# Patient Record
Sex: Male | Born: 1935
Health system: Southern US, Community
[De-identification: ages and names within clinical notes are randomized; demographics above are authoritative.]

## PROBLEM LIST (undated history)

## (undated) DIAGNOSIS — E119 Type 2 diabetes mellitus without complications: Secondary | ICD-10-CM

## (undated) DIAGNOSIS — G20 Parkinson's disease: Secondary | ICD-10-CM

## (undated) DIAGNOSIS — R7303 Prediabetes: Secondary | ICD-10-CM

## (undated) DIAGNOSIS — K219 Gastro-esophageal reflux disease without esophagitis: Secondary | ICD-10-CM

## (undated) DIAGNOSIS — E7800 Pure hypercholesterolemia, unspecified: Secondary | ICD-10-CM

## (undated) DIAGNOSIS — M109 Gout, unspecified: Secondary | ICD-10-CM

## (undated) HISTORY — DX: Parkinson's disease: G20

## (undated) HISTORY — PX: CATARACT EXTRACTION: SUR2

## (undated) HISTORY — DX: Type 2 diabetes mellitus without complications: E11.9

## (undated) HISTORY — DX: Gout, unspecified: M10.9

---

## 2019-04-22 ENCOUNTER — Other Ambulatory Visit: Payer: Self-pay

## 2019-04-22 ENCOUNTER — Emergency Department (HOSPITAL_BASED_OUTPATIENT_CLINIC_OR_DEPARTMENT_OTHER): Payer: Medicare Other

## 2019-04-22 ENCOUNTER — Emergency Department (HOSPITAL_BASED_OUTPATIENT_CLINIC_OR_DEPARTMENT_OTHER)
Admission: EM | Admit: 2019-04-22 | Discharge: 2019-04-22 | Disposition: A | Discharge status: Home / Self Care | Payer: Medicare Other | Attending: Emergency Medicine | Admitting: Emergency Medicine

## 2019-04-22 ENCOUNTER — Encounter (HOSPITAL_BASED_OUTPATIENT_CLINIC_OR_DEPARTMENT_OTHER): Payer: Self-pay | Admitting: Emergency Medicine

## 2019-04-22 DIAGNOSIS — Z79899 Other long term (current) drug therapy: Secondary | ICD-10-CM | POA: Insufficient documentation

## 2019-04-22 DIAGNOSIS — R509 Fever, unspecified: Secondary | ICD-10-CM | POA: Diagnosis not present

## 2019-04-22 DIAGNOSIS — R739 Hyperglycemia, unspecified: Secondary | ICD-10-CM

## 2019-04-22 DIAGNOSIS — R9389 Abnormal findings on diagnostic imaging of other specified body structures: Secondary | ICD-10-CM | POA: Diagnosis not present

## 2019-04-22 DIAGNOSIS — Z20828 Contact with and (suspected) exposure to other viral communicable diseases: Secondary | ICD-10-CM | POA: Insufficient documentation

## 2019-04-22 LAB — CBC
HCT: 36.9 % — ABNORMAL LOW (ref 39.0–52.0)
Hemoglobin: 12.6 g/dL — ABNORMAL LOW (ref 13.0–17.0)
MCH: 32.6 pg (ref 26.0–34.0)
MCHC: 34.1 g/dL (ref 30.0–36.0)
MCV: 95.6 fL (ref 80.0–100.0)
Platelets: 123 10*3/uL — ABNORMAL LOW (ref 150–400)
RBC: 3.86 MIL/uL — ABNORMAL LOW (ref 4.22–5.81)
RDW: 12.4 % (ref 11.5–15.5)
WBC: 15.1 10*3/uL — ABNORMAL HIGH (ref 4.0–10.5)
nRBC: 0 % (ref 0.0–0.2)

## 2019-04-22 LAB — URINALYSIS, ROUTINE W REFLEX MICROSCOPIC
Bilirubin Urine: NEGATIVE
Glucose, UA: 100 mg/dL — AB
Hgb urine dipstick: NEGATIVE
Ketones, ur: NEGATIVE mg/dL
Leukocytes,Ua: NEGATIVE
Nitrite: NEGATIVE
Protein, ur: NEGATIVE mg/dL
Specific Gravity, Urine: 1.02 (ref 1.005–1.030)
pH: 6 (ref 5.0–8.0)

## 2019-04-22 LAB — BASIC METABOLIC PANEL
Anion gap: 12 (ref 5–15)
BUN: 20 mg/dL (ref 8–23)
CO2: 20 mmol/L — ABNORMAL LOW (ref 22–32)
Calcium: 8.4 mg/dL — ABNORMAL LOW (ref 8.9–10.3)
Chloride: 103 mmol/L (ref 98–111)
Creatinine, Ser: 1.13 mg/dL (ref 0.61–1.24)
GFR calc Af Amer: >60 mL/min (ref >60)
GFR calc non Af Amer: >60 mL/min (ref >60)
Glucose, Bld: 258 mg/dL — ABNORMAL HIGH (ref 70–99)
Potassium: 3.8 mmol/L (ref 3.5–5.1)
Sodium: 135 mmol/L (ref 135–145)

## 2019-04-22 LAB — SARS CORONAVIRUS 2 BY RT PCR (HOSPITAL ORDER, PERFORMED IN ~~LOC~~ HOSPITAL LAB): SARS Coronavirus 2: NEGATIVE

## 2019-04-22 MED ORDER — DOXYCYCLINE HYCLATE 100 MG PO CAPS: 100.0000 mg | ORAL_CAPSULE | Freq: Two times a day (BID) | ORAL | 0 refills | Status: AC

## 2019-04-22 MED FILL — DOXYCYCLINE HYCLATE 100 MG: 100 | 10 days supply | Qty: 20 | Fill #0

## 2019-04-22 NOTE — ED Provider Notes (Addendum)
[place] EMERGENCY DEPARTMENT Provider Note   CSN: [id number] Arrival date & time: 04/22/19  0944    History   Chief Complaint Chief Complaint  Patient presents with  . Fever    HPI Jerry Vasquez is a 84 y.o. male.     HPI Patient presents to the emergency room for evaluation of fever.  Patient states he started having symptoms yesterday.  He measured fever up to 102.  He has had some chills and generalized malaise since then.  Last night he did not sleep well because he was not feeling good.  This morning he did not have a fever but he did take some ibuprofen.  He denies any cough.  He denies any sore throat.  He has not noticed any change in his smell or taste.  He denies any dysuria.  No vomiting or diarrhea.   History reviewed. No pertinent past medical history.  There are no active problems to display for this patient.   History reviewed. No pertinent surgical history.      Home Medications    Prior to Admission medications   Medication Sig Start Date End Date Taking? Authorizing Provider  doxycycline (VIBRAMYCIN) 100 MG capsule Take 1 capsule (100 mg total) by mouth 2 (two) times daily. 04/22/19   Dorie Rank, MD  omeprazole (PRILOSEC) 40 MG capsule Take 40 mg by mouth daily. 01/14/19   [provider]  pravastatin (PRAVACHOL) 40 MG tablet Take 40 mg by mouth daily. 03/11/19   [provider]    Family History No family history on file.  Social History Social History   Tobacco Use  . Smoking status: Not on file  Substance Use Topics  . Alcohol use: Not on file  . Drug use: Not on file     Allergies   Patient has no known allergies.   Review of Systems Review of Systems  All other systems reviewed and are negative.    Physical Exam Updated Vital Signs BP (!) 142/63 (BP Location: Right Arm)   Pulse 62   Temp 98 F (36.7 C) (Oral)   Resp 16   Ht 1.803 m (5\' 11" )   Wt 79.4 kg   SpO2 97%   BMI 24.41 kg/m    Physical Exam Vitals signs and nursing note reviewed.  Constitutional:      General: He is not in acute distress.    Appearance: He is well-developed.  HENT:     Head: Normocephalic and atraumatic.     Right Ear: External ear normal.     Left Ear: External ear normal.  Eyes:     General: No scleral icterus.       Right eye: No discharge.        Left eye: No discharge.     Conjunctiva/sclera: Conjunctivae normal.  Neck:     Musculoskeletal: Neck supple.     Trachea: No tracheal deviation.  Cardiovascular:     Rate and Rhythm: Normal rate and regular rhythm.  Pulmonary:     Effort: Pulmonary effort is normal. No respiratory distress.     Breath sounds: Normal breath sounds. No stridor. No wheezing or rales.  Abdominal:     General: Bowel sounds are normal. There is no distension.     Palpations: Abdomen is soft.     Tenderness: There is no abdominal tenderness. There is no guarding or rebound.  Musculoskeletal:        General: No tenderness.  Skin:  General: Skin is warm and dry.     Findings: No rash.  Neurological:     Mental Status: He is alert.     Cranial Nerves: No cranial nerve deficit (no facial droop, extraocular movements intact, no slurred speech).     Sensory: No sensory deficit.     Motor: No abnormal muscle tone or seizure activity.     Coordination: Coordination normal.      ED Treatments / Results  Labs (all labs ordered are listed, but only abnormal results are displayed) Labs Reviewed  CBC - Abnormal; Notable for the following components:      Result Value   WBC 15.1 (*)    RBC 3.86 (*)    Hemoglobin 12.6 (*)    HCT 36.9 (*)    Platelets 123 (*)    All other components within normal limits  BASIC METABOLIC PANEL - Abnormal; Notable for the following components:   CO2 20 (*)    Glucose, Bld 258 (*)    Calcium 8.4 (*)    All other components within normal limits  URINALYSIS, ROUTINE W REFLEX MICROSCOPIC - Abnormal; Notable for the following  components:   Glucose, UA 100 (*)    All other components within normal limits  SARS CORONAVIRUS 2 (HOSPITAL ORDER, [facility]FOR[facility] IN [facility])   EKG None  Radiology Dg Chest Portable 1 View  Result Date: 04/22/2019 CLINICAL DATA:  Low-grade fever. EXAM: PORTABLE CHEST 1 VIEW COMPARISON:  No recent. FINDINGS: Mediastinum hilar structures normal. Cardiomegaly with normal pulmonary vascularity. Mild right medial base subsegmental atelectasis/infiltrate. No pleural effusion pneumothorax. IMPRESSION: 1. Mild right medial base subsegmental atelectasis and or infiltrate. 2.  Cardiomegaly.  No pulmonary venous congestion. Electronically Signed   By: Maisie Fushomas  Register   On: 04/22/2019 10:40    Procedures Procedures (including critical care time)  Medications Ordered in ED Medications - No data to display   Initial Impression / Assessment and Plan / ED Course  I have reviewed the triage vital signs and the nursing notes.  Pertinent labs & imaging results that were available during my care of the patient were reviewed by me and considered in my medical decision making (see chart for details).   Patient presented with fevers and myalgias.  No other focal symptoms to suggest etiology.  COVIDtest is negative.  WBC and blood sugar elevated. Urinalysis does not suggest infection.  Chest x-ray suggests pneumonia versus atelectasis although patient does not have any respiratory symptoms.  I will start him on a course of doxycycline considering a possible pneumonia.  Discussed close outpatient follow-up.  Warning signs and precautions discussed.    Jerry Vasquez was evaluated in Emergency Department on 04/22/2019 for the symptoms described in the history of present illness. He was evaluated in the context of the global COVID-19 pandemic, which necessitated consideration that the patient might be at risk for infection with the SARS-CoV-2 virus that causes COVID-19. Institutional protocols and  algorithms that pertain to the evaluation of patients at risk for COVID-19 are in a state of rapid change based on information released by regulatory bodies including the CDC and federal and state organizations. These policies and algorithms were followed during the patient's care in the ED.   Final Clinical Impressions(s) / ED Diagnoses   Final diagnoses:  Fever, unspecified fever cause  Abnormal chest x-ray    ED Discharge Orders         Ordered    doxycycline (VIBRAMYCIN) 100 MG capsule  2 times daily     04/22/19 1149              Linwood DibblesKnapp, Dsean Vantol, MD 04/22/19 1153

## 2019-04-22 NOTE — ED Triage Notes (Addendum)
Pt reports fever/chills since yesterday, generalized weakness, no other symptoms. Stated temp 98.0 at home this morning,  Does report taking ibuprofen prior to arrival.

## 2019-04-22 NOTE — Discharge Instructions (Addendum)
The CXR showed possible atelectasis vs pneumonia.  Take the antibiotics as prescribed.  Follow up with your doctor to be rechecked later this week.  Return as needed for worsening symptoms

## 2019-07-15 ENCOUNTER — Emergency Department (HOSPITAL_BASED_OUTPATIENT_CLINIC_OR_DEPARTMENT_OTHER)
Admission: EM | Admit: 2019-07-15 | Discharge: 2019-07-16 | Disposition: A | Discharge status: Home / Self Care | Payer: Medicare Other | Attending: Emergency Medicine | Admitting: Emergency Medicine

## 2019-07-15 ENCOUNTER — Emergency Department (HOSPITAL_BASED_OUTPATIENT_CLINIC_OR_DEPARTMENT_OTHER): Payer: Medicare Other

## 2019-07-15 ENCOUNTER — Encounter (HOSPITAL_BASED_OUTPATIENT_CLINIC_OR_DEPARTMENT_OTHER): Payer: Self-pay

## 2019-07-15 ENCOUNTER — Other Ambulatory Visit: Payer: Self-pay

## 2019-07-15 DIAGNOSIS — R791 Abnormal coagulation profile: Secondary | ICD-10-CM | POA: Insufficient documentation

## 2019-07-15 DIAGNOSIS — M25462 Effusion, left knee: Secondary | ICD-10-CM | POA: Diagnosis not present

## 2019-07-15 DIAGNOSIS — E782 Mixed hyperlipidemia: Secondary | ICD-10-CM | POA: Insufficient documentation

## 2019-07-15 DIAGNOSIS — M25562 Pain in left knee: Secondary | ICD-10-CM | POA: Diagnosis present

## 2019-07-15 DIAGNOSIS — R7303 Prediabetes: Secondary | ICD-10-CM | POA: Insufficient documentation

## 2019-07-15 DIAGNOSIS — Z79899 Other long term (current) drug therapy: Secondary | ICD-10-CM | POA: Insufficient documentation

## 2019-07-15 DIAGNOSIS — M10062 Idiopathic gout, left knee: Secondary | ICD-10-CM | POA: Diagnosis not present

## 2019-07-15 DIAGNOSIS — Z20828 Contact with and (suspected) exposure to other viral communicable diseases: Secondary | ICD-10-CM | POA: Insufficient documentation

## 2019-07-15 DIAGNOSIS — R52 Pain, unspecified: Secondary | ICD-10-CM

## 2019-07-15 HISTORY — DX: Prediabetes: R73.03

## 2019-07-15 HISTORY — DX: Pure hypercholesterolemia, unspecified: E78.00

## 2019-07-15 HISTORY — DX: Gastro-esophageal reflux disease without esophagitis: K21.9

## 2019-07-15 LAB — URINALYSIS, ROUTINE W REFLEX MICROSCOPIC
Bilirubin Urine: NEGATIVE
Glucose, UA: >=500 mg/dL — AB
Ketones, ur: NEGATIVE mg/dL
Leukocytes,Ua: NEGATIVE
Nitrite: NEGATIVE
Protein, ur: 100 mg/dL — AB
Specific Gravity, Urine: >1.03 — ABNORMAL HIGH (ref 1.005–1.030)
pH: 5.5 (ref 5.0–8.0)

## 2019-07-15 LAB — COMPREHENSIVE METABOLIC PANEL
ALT: 15 U/L (ref 0–44)
AST: 15 U/L (ref 15–41)
Albumin: 4.4 g/dL (ref 3.5–5.0)
Alkaline Phosphatase: 67 U/L (ref 38–126)
Anion gap: 11 (ref 5–15)
BUN: 25 mg/dL — ABNORMAL HIGH (ref 8–23)
CO2: 24 mmol/L (ref 22–32)
Calcium: 8.8 mg/dL — ABNORMAL LOW (ref 8.9–10.3)
Chloride: 98 mmol/L (ref 98–111)
Creatinine, Ser: 0.92 mg/dL (ref 0.61–1.24)
GFR calc Af Amer: >60 mL/min (ref >60)
GFR calc non Af Amer: >60 mL/min (ref >60)
Glucose, Bld: 224 mg/dL — ABNORMAL HIGH (ref 70–99)
Potassium: 3.5 mmol/L (ref 3.5–5.1)
Sodium: 133 mmol/L — ABNORMAL LOW (ref 135–145)
Total Bilirubin: 1.1 mg/dL (ref 0.3–1.2)
Total Protein: 7.3 g/dL (ref 6.5–8.1)

## 2019-07-15 LAB — CBC WITH DIFFERENTIAL/PLATELET
Abs Immature Granulocytes: 0.08 10*3/uL — ABNORMAL HIGH (ref 0.00–0.07)
Basophils Absolute: 0 10*3/uL (ref 0.0–0.1)
Basophils Relative: 0 %
Eosinophils Absolute: 0 10*3/uL (ref 0.0–0.5)
Eosinophils Relative: 0 %
HCT: 44 % (ref 39.0–52.0)
Hemoglobin: 15 g/dL (ref 13.0–17.0)
Immature Granulocytes: 1 %
Lymphocytes Relative: 8 %
Lymphs Abs: 1.1 10*3/uL (ref 0.7–4.0)
MCH: 32.5 pg (ref 26.0–34.0)
MCHC: 34.1 g/dL (ref 30.0–36.0)
MCV: 95.4 fL (ref 80.0–100.0)
Monocytes Absolute: 1.6 10*3/uL — ABNORMAL HIGH (ref 0.1–1.0)
Monocytes Relative: 12 %
Neutro Abs: 11.4 10*3/uL — ABNORMAL HIGH (ref 1.7–7.7)
Neutrophils Relative %: 79 %
Platelets: 175 10*3/uL (ref 150–400)
RBC: 4.61 MIL/uL (ref 4.22–5.81)
RDW: 13 % (ref 11.5–15.5)
WBC: 14.3 10*3/uL — ABNORMAL HIGH (ref 4.0–10.5)
nRBC: 0 % (ref 0.0–0.2)

## 2019-07-15 LAB — PROTIME-INR
INR: 1.2 (ref 0.8–1.2)
Prothrombin Time: 15.3 seconds — ABNORMAL HIGH (ref 11.4–15.2)

## 2019-07-15 LAB — URINALYSIS, MICROSCOPIC (REFLEX): WBC, UA: NONE SEEN WBC/hpf (ref 0–5)

## 2019-07-15 LAB — APTT: aPTT: 32 seconds (ref 24–36)

## 2019-07-15 NOTE — ED Provider Notes (Signed)
[facility] EMERGENCY DEPARTMENT Provider Note   CSN: [id number] Arrival date & time: 07/15/19  1821     History   Chief Complaint Chief Complaint  Patient presents with  . Knee Pain    HPI Jerry Vasquez is a 84 y.o. male.     The history is provided by the patient, medical records and the spouse. No language interpreter was used.  Knee Pain   Jerry Vasquez is a 84 y.o. male who presents to the Emergency Department complaining of knee pain. He presents to the emergency department accompanied by his wife for evaluation of bilateral knee pain. He had a fall two weeks ago and landed on both knees. Over the last 24 to 48 hours he is experiencing difficult increased pain in his knees, particularly the left knee. Today his wife noticed that it was swollen as well. He has been having difficulty with generalized weakness over the last few days and difficulty getting to the bathroom with urinary frequency. He had a fever at home today per his wife. He does have some associated shortness of breath. Denies any nausea, vomiting, abdominal pain, diarrhea.  Past Medical History:  Diagnosis Date  . GERD (gastroesophageal reflux disease)   . High cholesterol   . Prediabetes     There are no active problems to display for this patient.   Past Surgical History:  Procedure Laterality Date  . CATARACT EXTRACTION          Home Medications    Prior to Admission medications   Medication Sig Start Date End Date Taking? Authorizing Provider  doxycycline (VIBRAMYCIN) 100 MG capsule Take 1 capsule (100 mg total) by mouth 2 (two) times daily. 04/22/19   Linwood DibblesKnapp, Jon, MD  omeprazole (PRILOSEC) 40 MG capsule Take 40 mg by mouth daily. 01/14/19   [provider]  pravastatin (PRAVACHOL) 40 MG tablet Take 40 mg by mouth daily. 03/11/19   [provider]    Family History No family history on file.  Social History Social History   Tobacco Use  . Smoking status: Never  Smoker  . Smokeless tobacco: Current User  Substance Use Topics  . Alcohol use: Yes    Frequency: Never    Comment: rare  . Drug use: Never     Allergies   Patient has no known allergies.   Review of Systems Review of Systems  All other systems reviewed and are negative.    Physical Exam Updated Vital Signs BP (!) 191/72   Pulse 75   Temp 97.8 F (36.6 C) (Oral)   Resp (!) 23   SpO2 96%   Physical Exam Vitals signs and nursing note reviewed.  Constitutional:      Appearance: He is well-developed.  HENT:     Head: Normocephalic and atraumatic.  Cardiovascular:     Rate and Rhythm: Normal rate and regular rhythm.     Heart sounds: No murmur.  Pulmonary:     Effort: Pulmonary effort is normal. No respiratory distress.     Breath sounds: Normal breath sounds.  Abdominal:     Palpations: Abdomen is soft.     Tenderness: There is no abdominal tenderness. There is no guarding or rebound.  Musculoskeletal:     Comments: Mild tenderness to the left knee. Flexion extension is intact at the knee. There is moderate swelling and fusion to the left knee. 2+ DP pulses bilaterally.  Skin:    General: Skin is warm and dry.  Neurological:  Mental Status: He is alert.     Comments: Mildly confused. Five out of five strength in all four extremities.  Psychiatric:        Behavior: Behavior normal.      ED Treatments / Results  Labs (all labs ordered are listed, but only abnormal results are displayed) Labs Reviewed  LACTIC ACID, PLASMA - Abnormal; Notable for the following components:      Result Value   Lactic Acid, Venous 2.2 (*)    All other components within normal limits  COMPREHENSIVE METABOLIC PANEL - Abnormal; Notable for the following components:   Sodium 133 (*)    Glucose, Bld 224 (*)    BUN 25 (*)    Calcium 8.8 (*)    All other components within normal limits  CBC WITH DIFFERENTIAL/PLATELET - Abnormal; Notable for the following components:   WBC 14.3  (*)    Neutro Abs 11.4 (*)    Monocytes Absolute 1.6 (*)    Abs Immature Granulocytes 0.08 (*)    All other components within normal limits  PROTIME-INR - Abnormal; Notable for the following components:   Prothrombin Time 15.3 (*)    All other components within normal limits  URINALYSIS, ROUTINE W REFLEX MICROSCOPIC - Abnormal; Notable for the following components:   Specific Gravity, Urine >1.030 (*)    Glucose, UA >=500 (*)    Hgb urine dipstick TRACE (*)    Protein, ur 100 (*)    All other components within normal limits  URINALYSIS, MICROSCOPIC (REFLEX) - Abnormal; Notable for the following components:   Bacteria, UA RARE (*)    All other components within normal limits  CBG MONITORING, ED - Abnormal; Notable for the following components:   Glucose-Capillary 190 (*)    All other components within normal limits  SARS CORONAVIRUS 2 BY RT PCR (HOSPITAL ORDER, [facility])  CULTURE, BLOOD (ROUTINE X 2)  URINE CULTURE  BODY FLUID CULTURE  LACTIC ACID, PLASMA  APTT  SYNOVIAL CELL COUNT + DIFF, W/ CRYSTALS  GLUCOSE, BODY FLUID OTHER    EKG None  Radiology Dg Chest Port 1 View  Result Date: 07/15/2019 CLINICAL DATA:  Recent fall left knee pain, fever and cough for 2 weeks EXAM: PORTABLE CHEST 1 VIEW COMPARISON:  Radiograph 06/07/2019 FINDINGS: No consolidation, features of edema, pneumothorax, or effusion. Pulmonary vascularity is normally distributed. Prominence of the cardiac silhouette likely related to portable technique. Rightward tracheal shift is similar to comparison studies. Degenerative changes are present in the imaged spine and shoulders. No acute osseous or soft tissue abnormality. IMPRESSION: No acute cardiopulmonary abnormality Electronically Signed   By: Lovena Le M.D.   On: 07/15/2019 23:31   Dg Knee Left Port  Result Date: 07/15/2019 CLINICAL DATA:  Fall with left knee pain EXAM: PORTABLE LEFT KNEE - 1-2 VIEW COMPARISON:   None. FINDINGS: There is soft tissue swelling anteriorly with mild thickening of the distal quadriceps and patellar tendons. Moderate joint effusion is seen. Fragmented enthesophyte arising from the inferior patellar insertion of the patellar tendon has a corticated appearance suggesting chronicity. Additional superior patellar enthesophyte is noted as well. No convincing acute fracture or traumatic malalignment. There are moderate degenerative changes of the patellofemoral compartment and mild changes of the medial and lateral femorotibial compartments. Chondrocalcinosis is noted in the medial and lateral meniscus. IMPRESSION: 1. Soft tissue swelling anteriorly. 2. Fragmented enthesophyte arising from the inferior patellar tendon has a corticated appearance suggesting chronicity. Could correlate for point  tenderness however. 3. Moderate joint effusion. 4. Chondrocalcinosis in the medial and lateral femorotibial compartments, suggestive of CPPD arthropathy. 5. Moderate patellofemoral compartment degenerative changes. Electronically Signed   By: Kreg Shropshire M.D.   On: 07/15/2019 23:34    Procedures .Joint Aspiration/Arthrocentesis  Date/Time: 07/16/2019 12:37 AM Performed by: Tilden Fossa, MD Authorized by: Tilden Fossa, MD   Consent:    Consent obtained:  Verbal   Consent given by:  Patient and spouse   Risks discussed:  Bleeding, infection, pain and nerve damage Location:    Location:  Knee   Knee:  L knee Anesthesia (see MAR for exact dosages):    Anesthesia method:  Local infiltration   Local anesthetic:  Lidocaine 2% w/o epi Procedure details:    Needle gauge:  18 G   Ultrasound guidance: no     Approach:  Lateral   Aspirate characteristics:  Cloudy and yellow   Steroid injected: no   Post-procedure details:    Dressing:  Adhesive bandage   Patient tolerance of procedure:  Tolerated well, no immediate complications   (including critical care time)  Medications Ordered in ED  Medications  lidocaine (XYLOCAINE) 2 % (with pres) injection 100 mg (100 mg Infiltration Given 07/16/19 0031)  sodium chloride 0.9 % bolus 500 mL (500 mLs Intravenous New Bag/Given 07/16/19 0044)     Initial Impression / Assessment and Plan / ED Course  I have reviewed the triage vital signs and the nursing notes.  Pertinent labs & imaging results that were available during my care of the patient were reviewed by me and considered in my medical decision making (see chart for details).        Patient here for evaluation of fever, weakness, knee swelling and pain. He is non-toxic appearing on evaluation. He does have a large palpable effusion to the left knee but is able to range it without difficulty. No clear source of fever at this time, discussed with patient and wife recommendation for aspiration to further evaluate and they are in agreement with plan. Joint was aspirated with return of cloudy yellow fluid, will send for analysis. Patient care transferred pending synovial fluid analysis.  Final Clinical Impressions(s) / ED Diagnoses   Final diagnoses:  Effusion of left knee joint    ED Discharge Orders    None       Tilden Fossa, MD 07/16/19 [phone]

## 2019-07-15 NOTE — ED Triage Notes (Addendum)
Pt c/o pain, swelling to left knee-denies injury-reports +fever-NAD-to triage in w/c-pt unsure of how long he has had a fever-called wife who is in car due to visitor policy-she agrees with chart that pt has +memory loss-she states pt fell on left knee ~2 weeks ago-pt with fever x today-she last gave him tylenol at 5pm-reviewed the chart/triage with wife-she is aware that she can come in with pt once he is in exam room-agreeable and pt agreeable

## 2019-07-15 NOTE — ED Notes (Signed)
Provider informed patient was venipunctured multiple times by three staff members for second set of blood cultures. Each was unsuccessful.

## 2019-07-16 ENCOUNTER — Telehealth: Payer: Self-pay | Admitting: *Deleted

## 2019-07-16 LAB — SYNOVIAL CELL COUNT + DIFF, W/ CRYSTALS
Eosinophils-Synovial: 0 % (ref 0–1)
Lymphocytes-Synovial Fld: 0 % (ref 0–20)
Monocyte-Macrophage-Synovial Fluid: 9 % — ABNORMAL LOW (ref 50–90)
Neutrophil, Synovial: 91 % — ABNORMAL HIGH (ref 0–25)
WBC, Synovial: 22100 /mm3 — ABNORMAL HIGH (ref 0–200)

## 2019-07-16 LAB — LACTIC ACID, PLASMA
Lactic Acid, Venous: 1.7 mmol/L (ref 0.5–1.9)
Lactic Acid, Venous: 2.2 mmol/L (ref 0.5–1.9)

## 2019-07-16 LAB — CBG MONITORING, ED: Glucose-Capillary: 190 mg/dL — ABNORMAL HIGH (ref 70–99)

## 2019-07-16 LAB — SARS CORONAVIRUS 2 BY RT PCR (HOSPITAL ORDER, PERFORMED IN ~~LOC~~ HOSPITAL LAB): SARS Coronavirus 2: NEGATIVE

## 2019-07-16 MED ORDER — LIDOCAINE HCL 2 % IJ SOLN
5.0000 mL | Freq: Once | INTRAMUSCULAR | Status: AC
  Administered 2019-07-16: 100 mg

## 2019-07-16 MED ORDER — HYDROCODONE-ACETAMINOPHEN 5-325 MG PO TABS: 1.0000 | ORAL_TABLET | Freq: Four times a day (QID) | ORAL | 0 refills | Status: AC | PRN

## 2019-07-16 MED ORDER — FENTANYL CITRATE (PF) 100 MCG/2ML IJ SOLN: 100.0000 ug | Freq: Once | INTRAMUSCULAR | Status: DC

## 2019-07-16 MED ORDER — PREDNISONE 50 MG PO TABS: ORAL_TABLET | ORAL | 0 refills | Status: AC

## 2019-07-16 MED ORDER — SODIUM CHLORIDE 0.9 % IV BOLUS
500.0000 mL | Freq: Once | INTRAVENOUS | Status: AC
  Administered 2019-07-16: 500 mL via INTRAVENOUS

## 2019-07-16 MED ORDER — PREDNISONE 50 MG PO TABS
60.0000 mg | ORAL_TABLET | Freq: Once | ORAL | Status: AC
  Administered 2019-07-16: 60 mg via ORAL
  Filled 2019-07-16: qty 1

## 2019-07-16 NOTE — Telephone Encounter (Signed)
TOC CM contacted pt and wife states it is not a good time to talk. She hung up phone. Unable to arrange [facility]. Will attempt to call 07/17/2019 to arrange [facility]. [place], [place] ED TOC CM [phone]

## 2019-07-16 NOTE — ED Notes (Signed)
Date and time results received: 07/16/19 0001  Test: lactic acid Critical Value: 2.2  Name of Provider Notified: Dr. Ralene Bathe  Orders Received? Or Actions Taken?: no new orders

## 2019-07-16 NOTE — ED Provider Notes (Signed)
Patient was monitored for several hours He is mildly hypertensive, but he never spiked a fever.  He is in no acute distress. I discussed the synovial fluid analysis with Dr. Wynelle Link with orthopedics.  This appears more consistent with gouty arthritis, particularly due to crystals were found as well as a lower white blood cell count  He suggests starting prednisone for several days to help with gout. Discussed the case at length with patient and his wife.  Patient insisted on going home.  This appears reasonable at this time.  He will start prednisone as well as pain medicines.  They will try to use a walker at home as I insisted try not to weight-bear on the left leg. Home health referral is also been put in.  Patient and wife are agreeable with plan   Ripley Fraise, MD 07/16/19 [phone]

## 2019-07-17 LAB — GLUCOSE, BODY FLUID OTHER: Glucose, Body Fluid Other: 143 mg/dL

## 2019-07-18 LAB — URINE CULTURE: Culture: 70000 — AB

## 2019-07-19 ENCOUNTER — Telehealth: Payer: Self-pay | Admitting: *Deleted

## 2019-07-19 LAB — BODY FLUID CULTURE: Culture: NO GROWTH

## 2019-07-19 NOTE — Progress Notes (Signed)
ED Antimicrobial Stewardship Positive Culture Follow Up   Jerry Vasquez is an 84 y.o. male who presented to [facility] on 07/15/2019 with a chief complaint of  Chief Complaint  Patient presents with  . Knee Pain    Recent Results (from the past 720 hour(s))  Urine culture     Status: Abnormal   Collection Time: 07/15/19 10:21 PM   Specimen: In/Out Cath Urine  Result Value Ref Range Status   Specimen Description   Final    IN/OUT CATH URINE Performed at [facility], [address] [place], [place] [address]    Special Requests   Final    NONE Performed at [facility], [address] [place], [place] [address]    Culture (A)  Final    70,000 COLONIES/mL COAGULASE NEGATIVE STAPHYLOCOCCUS CALL MICROBIOLOGY LAB IF SENSITIVITIES ARE REQUIRED. Performed at [facility], [address], [place], [place] [address]    Report Status 07/18/2019 FINAL  Final  Blood Culture (routine x 2)     Status: None (Preliminary result)   Collection Time: 07/15/19 10:39 PM   Specimen: BLOOD  Result Value Ref Range Status   Specimen Description   Final    BLOOD LEFT ANTECUBITAL Performed at [facility], [address] [place], [place] [address]    Special Requests   Final    BOTTLES DRAWN AEROBIC AND ANAEROBIC Blood Culture adequate volume Performed at [facility], [address] [place], [place] [address]    Culture   Final    NO GROWTH 3 DAYS Performed at [facility], [address], [place], [place] [address]    Report Status PENDING  Incomplete  SARS Coronavirus 2 by RT PCR (hospital order, performed in [facility] hospital lab) Nasopharyngeal Urine, Clean Catch     Status: None   Collection Time: 07/15/19 10:39 PM   Specimen: Urine, Clean Catch; Nasopharyngeal  Result Value Ref Range Status   SARS Coronavirus 2 NEGATIVE NEGATIVE Final    Comment: (NOTE) If result is NEGATIVE SARS-CoV-2 target nucleic acids are NOT  DETECTED. The SARS-CoV-2 RNA is generally detectable in upper and lower  respiratory specimens during the acute phase of infection. The lowest  concentration of SARS-CoV-2 viral copies this assay can detect is 250  copies / mL. A negative result does not preclude SARS-CoV-2 infection  and should not be used as the sole basis for treatment or other  patient management decisions.  A negative result may occur with  improper specimen collection / handling, submission of specimen other  than nasopharyngeal swab, presence of viral mutation(s) within the  areas targeted by this assay, and inadequate number of viral copies  (<250 copies / mL). A negative result must be combined with clinical  observations, patient history, and epidemiological information. If result is POSITIVE SARS-CoV-2 target nucleic acids are DETECTED. The SARS-CoV-2 RNA is generally detectable in upper and lower  respiratory specimens dur ing the acute phase of infection.  Positive  results are indicative of active infection with SARS-CoV-2.  Clinical  correlation with patient history and other diagnostic information is  necessary to determine patient infection status.  Positive results do  not rule out bacterial infection or co-infection with other viruses. If result is PRESUMPTIVE POSTIVE SARS-CoV-2 nucleic acids MAY BE PRESENT.   A presumptive positive result was obtained on the submitted specimen  and confirmed on repeat testing.  While 2019  novel coronavirus  (SARS-CoV-2) nucleic acids may be present in the submitted sample  additional confirmatory testing may be necessary for epidemiological  and / or clinical management purposes  to differentiate between  SARS-CoV-2 and other Sarbecovirus currently known to infect humans.  If clinically indicated additional testing with an alternate test  methodology [phone]) is advised. The SARS-CoV-2 RNA is generally  detectable in upper and lower respiratory sp ecimens during  the acute  phase of infection. The expected result is Negative. Fact Sheet for Patients:  StrictlyIdeas.no Fact Sheet for Healthcare Providers: BankingDealers.co.za This test is not yet approved or cleared by the [place] FDA and has been authorized for detection and/or diagnosis of SARS-CoV-2 by FDA under an Emergency Use Authorization (EUA).  This EUA will remain in effect (meaning this test can be used) for the duration of the COVID-19 declaration under Section 564(b)(1) of the Act, 21 U.S.C. section 360bbb-3(b)(1), unless the authorization is terminated or revoked sooner. Performed at [facility], [address] [place], [place] [address]   Body fluid culture     Status: None (Preliminary result)   Collection Time: 07/16/19 12:28 AM   Specimen: KNEE; Body Fluid  Result Value Ref Range Status   Specimen Description   Final    KNEE LEFT Performed at [facility], [address] [place], [place] [address]    Special Requests NONE  Final   Gram Stain   Final    ABUNDANT WBC PRESENT, PREDOMINANTLY PMN NO ORGANISMS SEEN    Culture   Final    NO GROWTH 2 DAYS Performed at [facility], [place] [address], [place], [place] [address]    Report Status PENDING  Incomplete    Patient discharged originally without antimicrobial agent.   No antibiotics needed.  ED Provider: Janeece Fitting, PA-C   Jerry Vasquez 07/19/2019, 9:54 AM Clinical Pharmacist Monday - Friday phone -  [phone] Saturday - Sunday phone - [phone]

## 2019-07-19 NOTE — Telephone Encounter (Signed)
Post ED Visit - Positive Culture Follow-up  Culture report reviewed by antimicrobial stewardship pharmacist: [place] Team []  Elenor Quinones, Pharm.D. []  Heide Guile, Pharm.D., BCPS AQ-ID []  Parks Neptune, Pharm.D., BCPS []  Alycia Rossetti, Pharm.D., BCPS []  [place], Pharm.D., BCPS, AAHIVP []  Legrand Como, Pharm.D., BCPS, AAHIVP []  Salome Arnt, PharmD, BCPS []  Johnnette Gourd, PharmD, BCPS []  Hughes Better, PharmD, BCPS []  Leeroy Cha, PharmD []  Laqueta Linden, PharmD, BCPS []  Albertina Parr, PharmD  [place] Team []  Leodis Sias, PharmD []  Lindell Spar, PharmD []  Royetta Asal, PharmD []  Graylin Shiver, Rph []  Rema Fendt) Glennon Mac, PharmD []  Arlyn Dunning, PharmD []  Netta Cedars, PharmD []  Dia Sitter, PharmD []  Leone Haven, PharmD []  Gretta Arab, PharmD []  Theodis Shove, PharmD []  Peggyann Juba, PharmD []  Reuel Boom, PharmD   Positive urine culture, reviewed by Janeece Fitting, PA No antibiotics needed and no further patient follow-up is required at this time.  Harlon Flor [facility] 07/19/2019, 11:24 AM

## 2019-07-21 LAB — CULTURE, BLOOD (ROUTINE X 2)
Culture: NO GROWTH
Special Requests: ADEQUATE

## 2020-09-17 ENCOUNTER — Other Ambulatory Visit: Payer: Self-pay

## 2020-09-17 ENCOUNTER — Encounter (HOSPITAL_BASED_OUTPATIENT_CLINIC_OR_DEPARTMENT_OTHER): Payer: Self-pay

## 2020-09-17 ENCOUNTER — Emergency Department (HOSPITAL_BASED_OUTPATIENT_CLINIC_OR_DEPARTMENT_OTHER)
Admission: EM | Admit: 2020-09-17 | Discharge: 2020-09-17 | Disposition: A | Discharge status: Home / Self Care | Payer: Medicare Other | Attending: Emergency Medicine | Admitting: Emergency Medicine

## 2020-09-17 ENCOUNTER — Emergency Department (HOSPITAL_BASED_OUTPATIENT_CLINIC_OR_DEPARTMENT_OTHER): Payer: Medicare Other

## 2020-09-17 DIAGNOSIS — W01198A Fall on same level from slipping, tripping and stumbling with subsequent striking against other object, initial encounter: Secondary | ICD-10-CM | POA: Diagnosis not present

## 2020-09-17 DIAGNOSIS — G20 Parkinson's disease: Secondary | ICD-10-CM | POA: Diagnosis not present

## 2020-09-17 DIAGNOSIS — S02610A Fracture of condylar process of mandible, unspecified side, initial encounter for closed fracture: Secondary | ICD-10-CM

## 2020-09-17 DIAGNOSIS — S02612A Fracture of condylar process of left mandible, initial encounter for closed fracture: Secondary | ICD-10-CM | POA: Diagnosis not present

## 2020-09-17 DIAGNOSIS — S0990XA Unspecified injury of head, initial encounter: Secondary | ICD-10-CM | POA: Diagnosis present

## 2020-09-17 DIAGNOSIS — S0181XA Laceration without foreign body of other part of head, initial encounter: Secondary | ICD-10-CM | POA: Diagnosis not present

## 2020-09-17 MED ORDER — CIPROFLOXACIN-DEXAMETHASONE 0.3-0.1 % OT SUSP: 4.0000 [drp] | Freq: Two times a day (BID) | OTIC | 0 refills | Status: AC

## 2020-09-17 MED ORDER — CIPROFLOXACIN-DEXAMETHASONE 0.3-0.1 % OT SUSP
4.0000 [drp] | Freq: Once | OTIC | Status: AC
  Administered 2020-09-17: 16:00:00 4 [drp] via OTIC
  Filled 2020-09-17: qty 7.5

## 2020-09-17 MED ORDER — LIDOCAINE-EPINEPHRINE (PF) 2 %-1:200000 IJ SOLN
10.0000 mL | Freq: Once | INTRAMUSCULAR | Status: AC
  Administered 2020-09-17: 10 mL
  Filled 2020-09-17: qty 20

## 2020-09-17 NOTE — ED Provider Notes (Signed)
..  Laceration Repair  Date/Time: 09/17/2020 4:18 PM Performed by: Haskel Schroeder, PA-C Authorized by: Haskel Schroeder, PA-C   Consent:    Consent obtained:  Verbal   Consent given by:  Patient and spouse   Risks discussed:  Infection, pain, poor cosmetic result and poor wound healing   Alternatives discussed:  No treatment Universal protocol:    Procedure explained and questions answered to patient or proxy's satisfaction: yes   Anesthesia:    Anesthesia method:  Local infiltration   Local anesthetic:  Lidocaine 1% WITH epi Laceration details:    Location:  Face   Face location:  Chin   Length (cm):  1   Depth (mm):  5 Pre-procedure details:    Preparation:  Patient was prepped and draped in usual sterile fashion Exploration:    Hemostasis achieved with:  Direct pressure   Imaging outcome: foreign body not noted     Wound exploration: entire depth of wound visualized   Treatment:    Area cleansed with:  Povidone-iodine   Amount of cleaning:  Standard   Irrigation solution:  Sterile saline   Irrigation method:  Syringe Skin repair:    Repair method:  Sutures   Suture size:  4-0   Wound skin closure material used: vicryl    Suture technique:  Simple interrupted   Number of sutures:  2 Approximation:    Approximation:  Close Repair type:    Repair type:  Simple Post-procedure details:    Dressing:  Sterile dressing   Procedure completion:  Tolerated well, no immediate complications     Haskel Schroeder, PA-C 09/17/20 1621    Derwood Kaplan, MD 09/25/20 2258

## 2020-09-17 NOTE — ED Triage Notes (Signed)
Per pt and wife pt lost balance and fell ~1hour PTA-had bleeding from left ear, abrasion to nose, lac to chin, pain to knees and pain to jaw-denies LOC-NAD-to triage in w/c

## 2020-09-17 NOTE — ED Provider Notes (Addendum)
[facility] EMERGENCY DEPARTMENT Provider Note   CSN: [id number] Arrival date & time: 09/17/20  1243     History Chief Complaint  Patient presents with   Jerry Vasquez is a 85 y.o. male.  HPI    85 year old male comes in a chief complaint of fall. Patient was pushing down tire and lost his balance. Patient fell onto his left side and injured the left side of his face, nose and his chin. He is having bleeding from his left ear.  Patient denies any tinnitus or whooshing noise from his left ear.  Patient has no numbness, tingling.  He is having mild headache.  He denies any neck pain, chest pain, shortness of breath.  Patient is ambulated , up-to-date with tetanus.  Past Medical History:  Diagnosis Date   GERD (gastroesophageal reflux disease)    Gout    High cholesterol    Parkinson's disease (HCC)    Prediabetes     There are no problems to display for this patient.   Past Surgical History:  Procedure Laterality Date   CATARACT EXTRACTION         No family history on file.  Social History   Tobacco Use   Smoking status: Never Smoker   Smokeless tobacco: Current User  Vaping Use   Vaping Use: Never used  Substance Use Topics   Alcohol use: Yes    Comment: rare   Drug use: Never    Home Medications Prior to Admission medications   Medication Sig Start Date End Date Taking? Authorizing Provider  ciprofloxacin-dexamethasone (CIPRODEX) OTIC suspension Place 4 drops into the left ear 2 (two) times daily. 09/17/20   Derwood Kaplan, MD  doxycycline (VIBRAMYCIN) 100 MG capsule Take 1 capsule (100 mg total) by mouth 2 (two) times daily. 04/22/19   Linwood Dibbles, MD  HYDROcodone-acetaminophen (NORCO/VICODIN) 5-325 MG tablet Take 1 tablet by mouth every 6 (six) hours as needed for severe pain. 07/16/19   Zadie Rhine, MD  omeprazole (PRILOSEC) 40 MG capsule Take 40 mg by mouth daily. 01/14/19   [provider]  pravastatin  (PRAVACHOL) 40 MG tablet Take 40 mg by mouth daily. 03/11/19   [provider]  predniSONE (DELTASONE) 50 MG tablet 1 tablet PO QD X5 days 07/16/19   Zadie Rhine, MD    Allergies    Patient has no known allergies.  Review of Systems   Review of Systems  Constitutional: Positive for activity change.  HENT: Negative for hearing loss.   Respiratory: Negative for shortness of breath.   Cardiovascular: Negative for chest pain.  Skin: Positive for wound.  Hematological: Does not bruise/bleed easily.    Physical Exam Updated Vital Signs BP (!) 178/81    Pulse 68    Temp 98.1 F (36.7 C) (Oral)    Resp 16    Ht 5\' 11"  (1.803 m)    Wt 77.1 kg    SpO2 99%    BMI 23.71 kg/m   Physical Exam Vitals and nursing note reviewed.  Constitutional:      Appearance: He is well-developed.  HENT:     Head: Atraumatic.  Cardiovascular:     Rate and Rhythm: Normal rate.  Pulmonary:     Effort: Pulmonary effort is normal.  Musculoskeletal:     Cervical back: Neck supple.  Skin:    General: Skin is warm.     Comments: 1.5 cm laceration to the chin.  Patient has edema of his nose  without any septal hematoma.  His ear canal has blood, but there is no active bleeding appreciated.  Unable to visualize the TM.  Patient's hearing appears to be grossly normal on the left side.  Neurological:     Mental Status: He is alert and oriented to person, place, and time.     ED Results / Procedures / Treatments   Labs (all labs ordered are listed, but only abnormal results are displayed) Labs Reviewed - No data to display  EKG None  Radiology CT Head Wo Contrast  Result Date: 09/17/2020 CLINICAL DATA:  Facial injury after fall. EXAM: CT HEAD WITHOUT CONTRAST CT MAXILLOFACIAL WITHOUT CONTRAST TECHNIQUE: Multidetector CT imaging of the head and maxillofacial structures were performed using the standard protocol without intravenous contrast. Multiplanar CT image reconstructions of the  maxillofacial structures were also generated. COMPARISON:  None. FINDINGS: CT HEAD FINDINGS Brain: Mild chronic ischemic white matter disease is noted. Mild diffuse cortical atrophy is noted. No mass effect or midline shift is noted. Ventricular size is within normal limits. There is no evidence of mass lesion, hemorrhage or acute infarction. Vascular: No hyperdense vessel or unexpected calcification. Skull: Normal. Negative for fracture or focal lesion. Other: None. CT MAXILLOFACIAL FINDINGS Osseous: Nondisplaced fracture is seen involving the left mandibular condyle. Moderately displaced fracture is seen involving the right mandibular condyle. Orbits: Negative. No traumatic or inflammatory finding. Sinuses: Clear. Soft tissues: Negative. IMPRESSION: 1. Mild chronic ischemic white matter disease. Mild diffuse cortical atrophy. No acute intracranial abnormality seen. 2. Nondisplaced fracture is seen involving the left mandibular condyle. Moderately displaced fracture is seen involving the right mandibular condyle. Electronically Signed   By: Lupita Raider M.D.   On: 09/17/2020 14:51   CT Maxillofacial WO CM  Result Date: 09/17/2020 CLINICAL DATA:  Facial injury after fall. EXAM: CT HEAD WITHOUT CONTRAST CT MAXILLOFACIAL WITHOUT CONTRAST TECHNIQUE: Multidetector CT imaging of the head and maxillofacial structures were performed using the standard protocol without intravenous contrast. Multiplanar CT image reconstructions of the maxillofacial structures were also generated. COMPARISON:  None. FINDINGS: CT HEAD FINDINGS Brain: Mild chronic ischemic white matter disease is noted. Mild diffuse cortical atrophy is noted. No mass effect or midline shift is noted. Ventricular size is within normal limits. There is no evidence of mass lesion, hemorrhage or acute infarction. Vascular: No hyperdense vessel or unexpected calcification. Skull: Normal. Negative for fracture or focal lesion. Other: None. CT MAXILLOFACIAL  FINDINGS Osseous: Nondisplaced fracture is seen involving the left mandibular condyle. Moderately displaced fracture is seen involving the right mandibular condyle. Orbits: Negative. No traumatic or inflammatory finding. Sinuses: Clear. Soft tissues: Negative. IMPRESSION: 1. Mild chronic ischemic white matter disease. Mild diffuse cortical atrophy. No acute intracranial abnormality seen. 2. Nondisplaced fracture is seen involving the left mandibular condyle. Moderately displaced fracture is seen involving the right mandibular condyle. Electronically Signed   By: Lupita Raider M.D.   On: 09/17/2020 14:51    Procedures Procedures (including critical care time)  Medications Ordered in ED Medications  ciprofloxacin-dexamethasone (CIPRODEX) 0.3-0.1 % OTIC (EAR) suspension 4 drop (has no administration in time range)  lidocaine-EPINEPHrine (XYLOCAINE W/EPI) 2 %-1:200000 (PF) injection 10 mL (10 mLs Infiltration Given 09/17/20 1530)    ED Course  I have reviewed the triage vital signs and the nursing notes.  Pertinent labs & imaging results that were available during my care of the patient were reviewed by me and considered in my medical decision making (see chart for details).  Clinical Course  as of 09/17/20 1602  Thu Sep 17, 2020  1600 Discussed case with Dr. Pollyann Kennedy.  He wants patient to have soft diet for 6 weeks and follow-up with ENT in 2 weeks.  He suspects that the fracture will heal on its own.  Results of the ER work-up discussed with the patient.  ENT recommendations also discussed. [AN]    Clinical Course User Index [AN] Derwood Kaplan, MD   MDM Rules/Calculators/A&P                          85 year old male comes in a chief complaint of fall.  Patient had a mechanical fall onto his left side.  He fell onto concrete surface.  CT head, face ordered based on the findings there were described in the history and exam.  Clinically does not appear that patient has TM rupture.  We will  put him on topical antibiotic for the ear injury.  His tetanus is up-to-date. Claudie Fisherman will need repair with  Sutures.  Final Clinical Impression(s) / ED Diagnoses Final diagnoses:  Facial laceration, initial encounter  Closed fracture of condylar process of mandible, unspecified laterality, initial encounter [facility])    Rx / DC Orders ED Discharge Orders         Ordered    ciprofloxacin-dexamethasone (CIPRODEX) OTIC suspension  2 times daily        09/17/20 1559           Derwood Kaplan, MD 09/17/20 1446    Derwood Kaplan, MD 09/17/20 [phone]

## 2020-09-17 NOTE — Discharge Instructions (Signed)
You have a jaw fracture that should heal on its own.  Soft diet is recommended for 6 weeks.  Follow-up with the ENT doctors in 2 weeks.  Please apply the topical ointment to the ear.

## 2022-12-04 IMAGING — CT CT HEAD W/O CM
3 series · 15 of 47 positions shown, 18 images · non-contrast
Comparison: None.

CLINICAL DATA: Facial injury after fall.

EXAM:
CT HEAD WITHOUT CONTRAST
CT MAXILLOFACIAL WITHOUT CONTRAST
TECHNIQUE: Multidetector CT imaging of the head and maxillofacial structures
were performed using the standard protocol without intravenous
contrast. Multiplanar CT image reconstructions of the maxillofacial
structures were also generated.

[Series 2: head wo · axial · 0.43mm/px · z∈[+27,+162]mm · 9 of 33 slices shown, 12 images]
[im 3/33  brain]
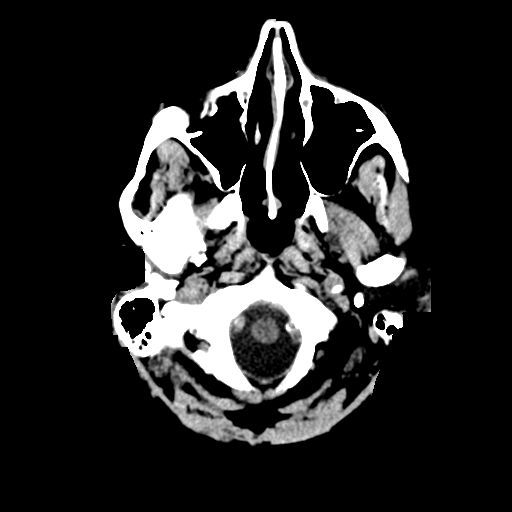
[im 3/33  bone]
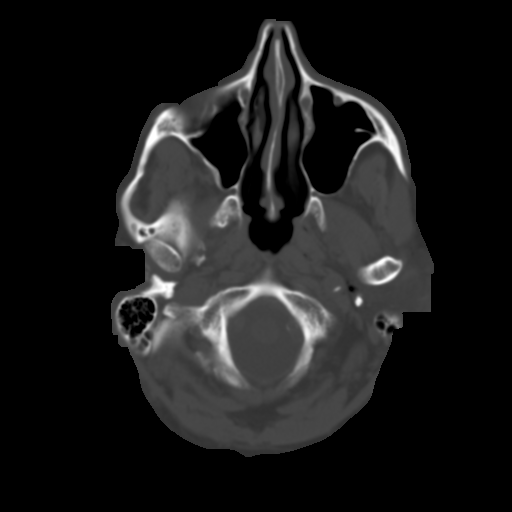
[im 6/33  brain]
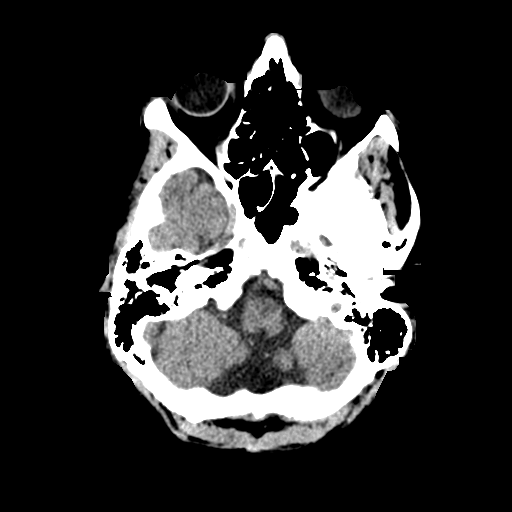
[im 9/33  brain]
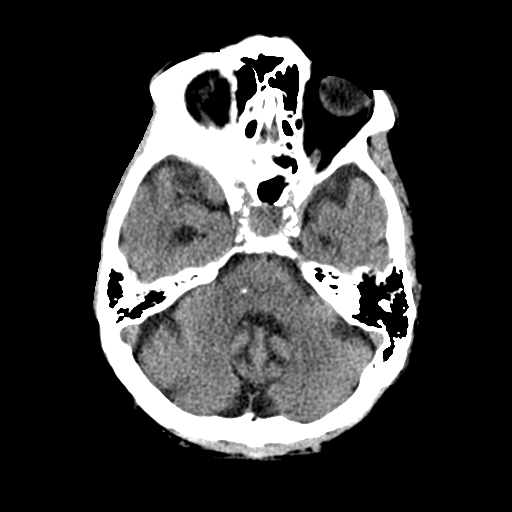
[im 13/33  brain]
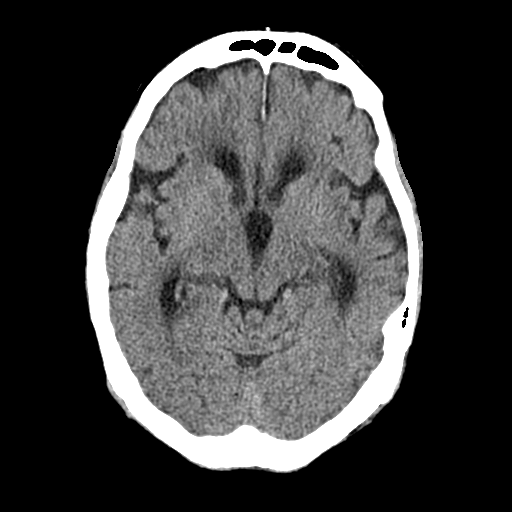
[im 17/33  brain]
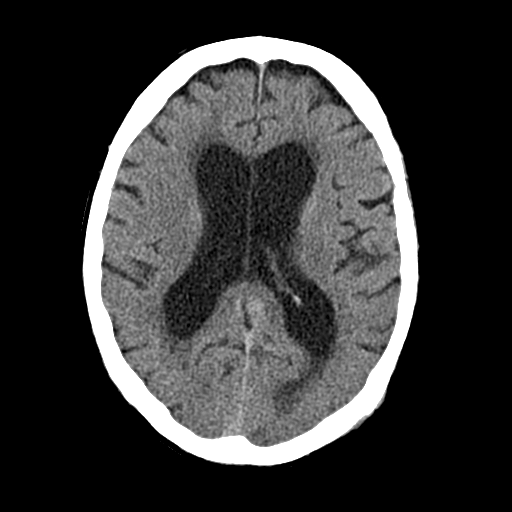
[im 17/33  bone]
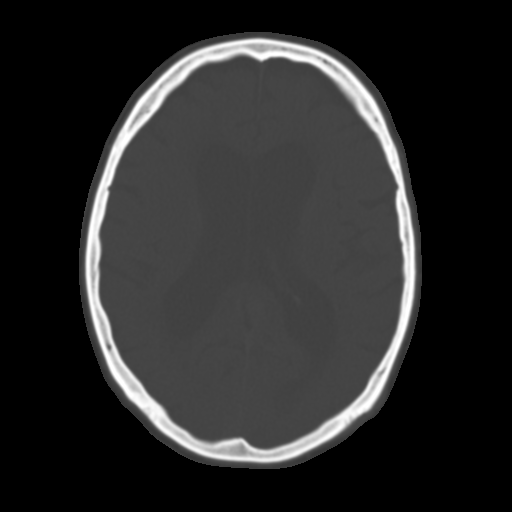
[im 20/33  brain]
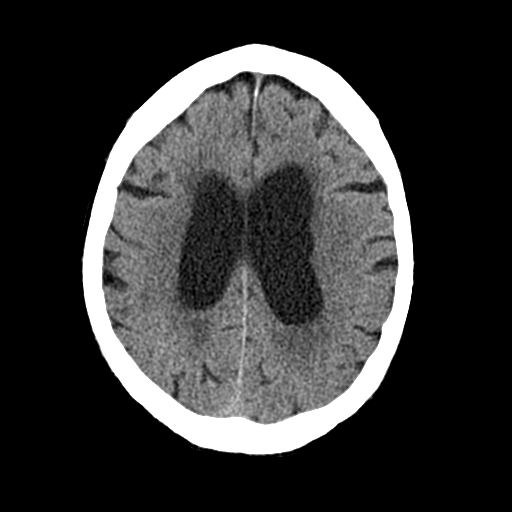
[im 24/33  brain]
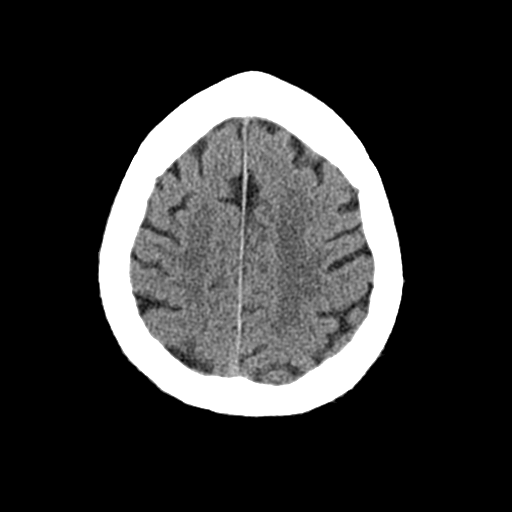
[im 27/33  brain]
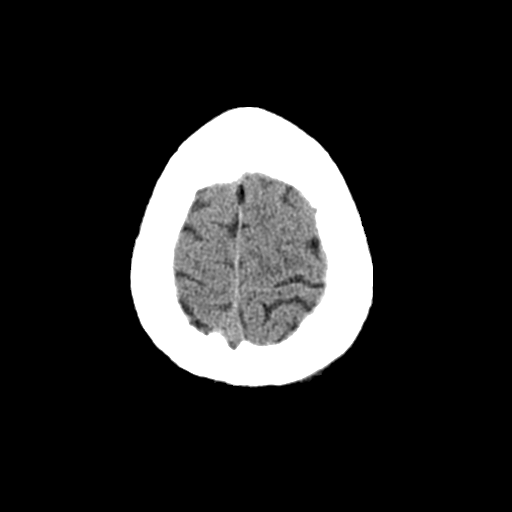
[im 30/33  brain]
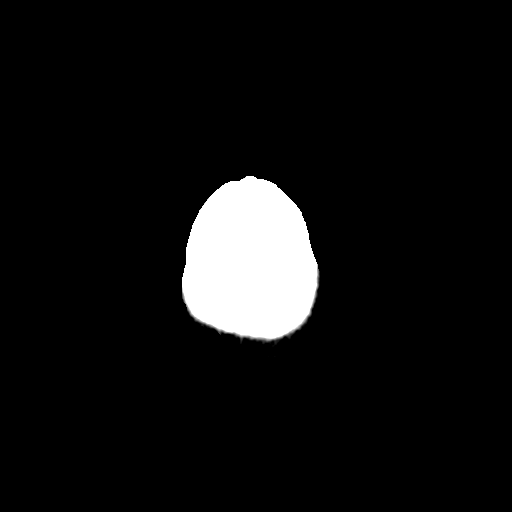
[im 30/33  bone]
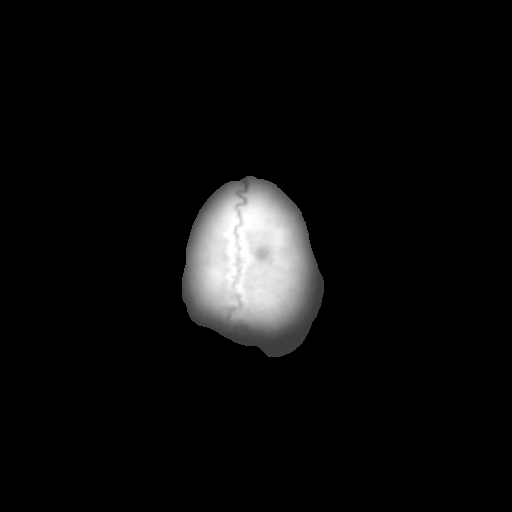

[Series 4: cor head wo · coronal · 0.34mm/px · 3 of 70 slices shown]
[im 24/70  brain]
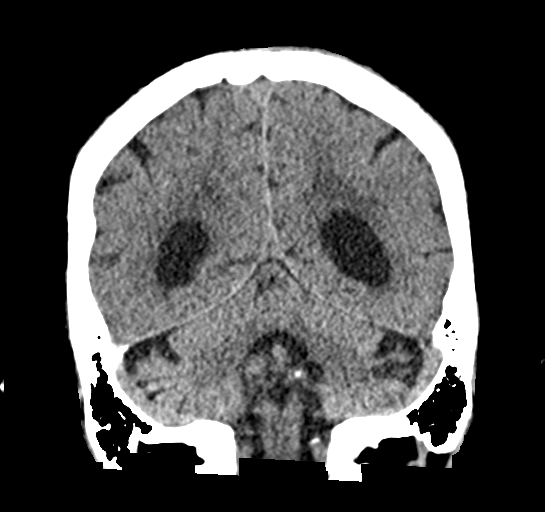
[im 31/70  brain]
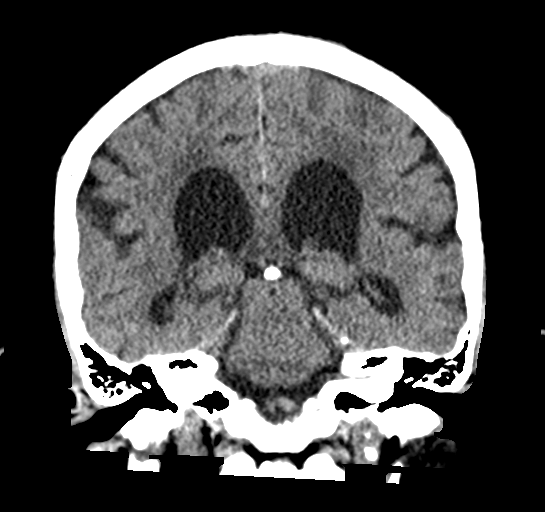
[im 39/70  brain]
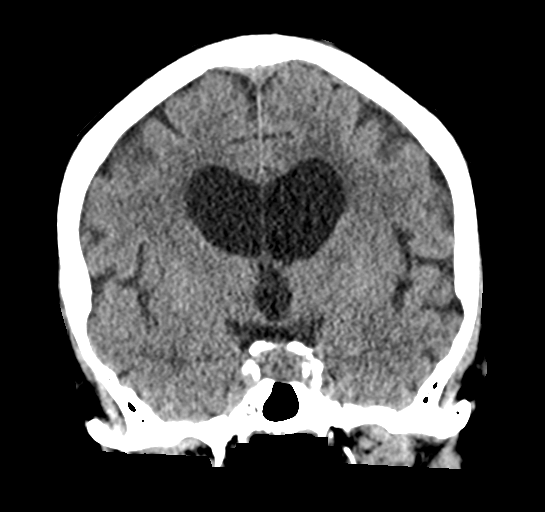

[Series 5: sag head wo · sagittal · 0.33mm/px · 3 of 56 slices shown]
[im 19/56  brain]
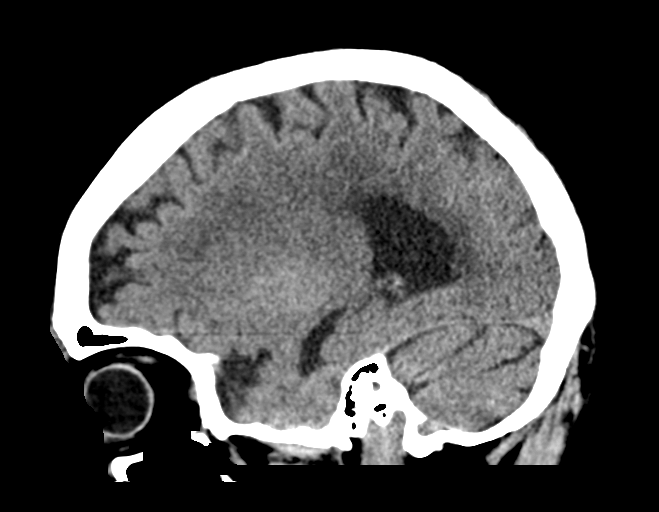
[im 28/56  brain]
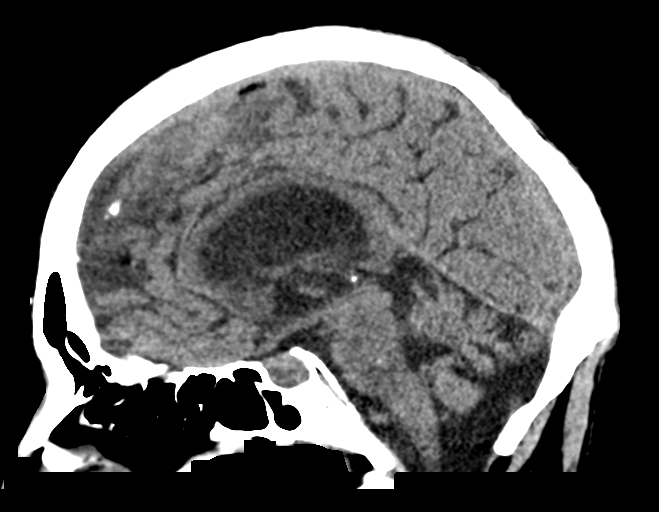
[im 37/56  brain]
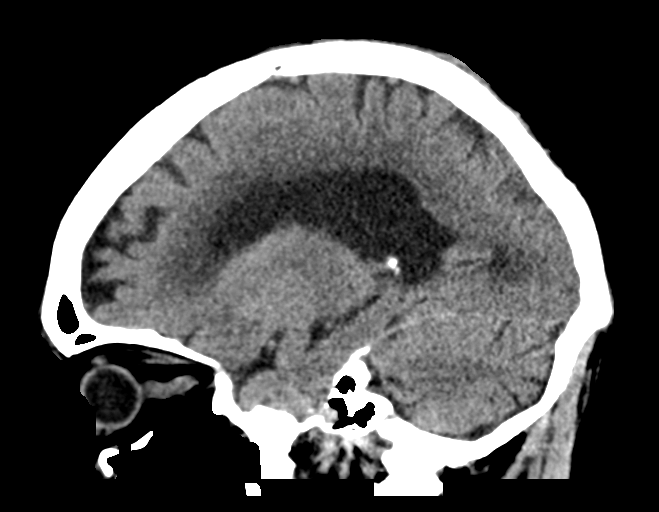

[15 of 47 positions shown; findings below may reference images not displayed]

FINDINGS: CT HEAD FINDINGS

Brain: Mild chronic ischemic white matter disease is noted. Mild
diffuse cortical atrophy is noted. No mass effect or midline shift
is noted. Ventricular size is within normal limits. There is no
evidence of mass lesion, hemorrhage or acute infarction.

Vascular: No hyperdense vessel or unexpected calcification.

Skull: Normal. Negative for fracture or focal lesion.

Other: None.

CT MAXILLOFACIAL FINDINGS

Osseous: Nondisplaced fracture is seen involving the left mandibular
condyle. Moderately displaced fracture is seen involving the right
mandibular condyle.

Orbits: Negative. No traumatic or inflammatory finding.

Sinuses: Clear.

Soft tissues: Negative.
IMPRESSION: 1. Mild chronic ischemic white matter disease. Mild diffuse cortical
atrophy. No acute intracranial abnormality seen.
2. Nondisplaced fracture is seen involving the left mandibular
condyle. Moderately displaced fracture is seen involving the right
mandibular condyle.

## 2023-03-26 ENCOUNTER — Other Ambulatory Visit: Payer: Self-pay

## 2023-03-26 ENCOUNTER — Emergency Department (HOSPITAL_BASED_OUTPATIENT_CLINIC_OR_DEPARTMENT_OTHER)
Admission: EM | Admit: 2023-03-26 | Discharge: 2023-03-26 | Disposition: A | Discharge status: Home / Self Care | Payer: Medicare Other | Attending: Emergency Medicine | Admitting: Emergency Medicine

## 2023-03-26 ENCOUNTER — Encounter (HOSPITAL_BASED_OUTPATIENT_CLINIC_OR_DEPARTMENT_OTHER): Payer: Self-pay

## 2023-03-26 ENCOUNTER — Emergency Department (HOSPITAL_BASED_OUTPATIENT_CLINIC_OR_DEPARTMENT_OTHER): Payer: Medicare Other

## 2023-03-26 DIAGNOSIS — D649 Anemia, unspecified: Secondary | ICD-10-CM | POA: Insufficient documentation

## 2023-03-26 DIAGNOSIS — Y92002 Bathroom of unspecified non-institutional (private) residence single-family (private) house as the place of occurrence of the external cause: Secondary | ICD-10-CM | POA: Insufficient documentation

## 2023-03-26 DIAGNOSIS — R55 Syncope and collapse: Secondary | ICD-10-CM | POA: Insufficient documentation

## 2023-03-26 DIAGNOSIS — I10 Essential (primary) hypertension: Secondary | ICD-10-CM | POA: Insufficient documentation

## 2023-03-26 DIAGNOSIS — S0101XA Laceration without foreign body of scalp, initial encounter: Secondary | ICD-10-CM | POA: Insufficient documentation

## 2023-03-26 DIAGNOSIS — W228XXA Striking against or struck by other objects, initial encounter: Secondary | ICD-10-CM | POA: Diagnosis not present

## 2023-03-26 DIAGNOSIS — E1165 Type 2 diabetes mellitus with hyperglycemia: Secondary | ICD-10-CM | POA: Insufficient documentation

## 2023-03-26 DIAGNOSIS — S0990XA Unspecified injury of head, initial encounter: Secondary | ICD-10-CM | POA: Diagnosis present

## 2023-03-26 DIAGNOSIS — G20A1 Parkinson's disease without dyskinesia, without mention of fluctuations: Secondary | ICD-10-CM | POA: Insufficient documentation

## 2023-03-26 LAB — CBC WITH DIFFERENTIAL/PLATELET
Abs Immature Granulocytes: 0.03 10*3/uL (ref 0.00–0.07)
Basophils Absolute: 0.1 10*3/uL (ref 0.0–0.1)
Basophils Relative: 1 %
Eosinophils Absolute: 0.1 10*3/uL (ref 0.0–0.5)
Eosinophils Relative: 2 %
HCT: 36.9 % — ABNORMAL LOW (ref 39.0–52.0)
Hemoglobin: 11.9 g/dL — ABNORMAL LOW (ref 13.0–17.0)
Immature Granulocytes: 0 %
Lymphocytes Relative: 14 %
Lymphs Abs: 1.1 10*3/uL (ref 0.7–4.0)
MCH: 27.3 pg (ref 26.0–34.0)
MCHC: 32.2 g/dL (ref 30.0–36.0)
MCV: 84.6 fL (ref 80.0–100.0)
Monocytes Absolute: 0.6 10*3/uL (ref 0.1–1.0)
Monocytes Relative: 8 %
Neutro Abs: 6.4 10*3/uL (ref 1.7–7.7)
Neutrophils Relative %: 75 %
Platelets: 192 10*3/uL (ref 150–400)
RBC: 4.36 MIL/uL (ref 4.22–5.81)
RDW: 15.9 % — ABNORMAL HIGH (ref 11.5–15.5)
WBC: 8.3 10*3/uL (ref 4.0–10.5)
nRBC: 0 % (ref 0.0–0.2)

## 2023-03-26 LAB — URINALYSIS, ROUTINE W REFLEX MICROSCOPIC
Bilirubin Urine: NEGATIVE
Glucose, UA: NEGATIVE mg/dL
Hgb urine dipstick: NEGATIVE
Ketones, ur: NEGATIVE mg/dL
Leukocytes,Ua: NEGATIVE
Nitrite: NEGATIVE
Protein, ur: NEGATIVE mg/dL
Specific Gravity, Urine: 1.02 (ref 1.005–1.030)
pH: 6.5 (ref 5.0–8.0)

## 2023-03-26 LAB — COMPREHENSIVE METABOLIC PANEL
ALT: 7 U/L (ref 0–44)
AST: 17 U/L (ref 15–41)
Albumin: 4.2 g/dL (ref 3.5–5.0)
Alkaline Phosphatase: 62 U/L (ref 38–126)
Anion gap: 10 (ref 5–15)
BUN: 24 mg/dL — ABNORMAL HIGH (ref 8–23)
CO2: 24 mmol/L (ref 22–32)
Calcium: 9 mg/dL (ref 8.9–10.3)
Chloride: 101 mmol/L (ref 98–111)
Creatinine, Ser: 1.17 mg/dL (ref 0.61–1.24)
GFR, Estimated: >60 mL/min (ref >60)
Glucose, Bld: 137 mg/dL — ABNORMAL HIGH (ref 70–99)
Potassium: 4.5 mmol/L (ref 3.5–5.1)
Sodium: 135 mmol/L (ref 135–145)
Total Bilirubin: 0.6 mg/dL (ref 0.3–1.2)
Total Protein: 7.2 g/dL (ref 6.5–8.1)

## 2023-03-26 LAB — TROPONIN I (HIGH SENSITIVITY): Troponin I (High Sensitivity): 3 ng/L (ref <18)

## 2023-03-26 LAB — MAGNESIUM: Magnesium: 2.2 mg/dL (ref 1.7–2.4)

## 2023-03-26 LAB — CBG MONITORING, ED: Glucose-Capillary: 108 mg/dL — ABNORMAL HIGH (ref 70–99)

## 2023-03-26 MED ORDER — LIDOCAINE-EPINEPHRINE-TETRACAINE (LET) TOPICAL GEL
3.0000 mL | Freq: Once | TOPICAL | Status: AC
  Administered 2023-03-26: 3 mL via TOPICAL
  Filled 2023-03-26: qty 3

## 2023-03-26 MED ORDER — LIDOCAINE-EPINEPHRINE (PF) 2 %-1:200000 IJ SOLN
10.0000 mL | Freq: Once | INTRAMUSCULAR | Status: AC
  Administered 2023-03-26: 10 mL
  Filled 2023-03-26: qty 20

## 2023-03-26 NOTE — ED Provider Notes (Signed)
[place] [facility] Provider Note   CSN: [id number] Arrival date & time: 03/26/23  1034     History Chief Complaint  Patient presents with   Head Laceration   Loss of Consciousness    Jerry Vasquez is a 88 y.o. male with history of Parkinson's, type 2 diabetes, GERD, hypertension, and syncope and collapse presents emerged from today for evaluation of syncopal episode in the bathroom.  The patient hit his head on something and has a large laceration to the back of his scalp.  He is not on a blood thinner.  He seems mildly confused however wife at bedside reports this is at his baseline.  She reports that he has had syncopal episodes for the past 60+ years but been more prevalent in the past 5 years.  They have been followed up with with cardiology and have a loop recorder however they cannot find a reason why he is passing out any that may be a vasovagal response.  Patient reports that he was standing in the bathroom and shaving and then he woke up on the floor.  Wife reports that he did not have any breakfast this morning does not know if this is what it was from as well.  She reports his tetanus is up-to-date in the past 2 to 3 years.  He currently denies any symptoms.  Denies any chest pain, shortness of breath, belly pain, chest pain, back pain, or extremity pain.  No known drug allergies.  Denies any tobacco, EtOH, or drug use.   Head Laceration Associated symptoms include headaches. Pertinent negatives include no chest pain, no abdominal pain and no shortness of breath.  Loss of Consciousness Associated symptoms: headaches   Associated symptoms: no chest pain, no fever, no nausea, no palpitations, no shortness of breath and no vomiting        Home Medications Prior to Admission medications   Medication Sig Start Date End Date Taking? Authorizing Provider  ciprofloxacin-dexamethasone (CIPRODEX) OTIC suspension Place 4 drops into the left ear 2  (two) times daily. 09/17/20   Derwood Kaplan, MD  doxycycline (VIBRAMYCIN) 100 MG capsule Take 1 capsule (100 mg total) by mouth 2 (two) times daily. 04/22/19   Linwood Dibbles, MD  HYDROcodone-acetaminophen (NORCO/VICODIN) 5-325 MG tablet Take 1 tablet by mouth every 6 (six) hours as needed for severe pain. 07/16/19   Zadie Rhine, MD  omeprazole (PRILOSEC) 40 MG capsule Take 40 mg by mouth daily. 01/14/19   [provider]  pravastatin (PRAVACHOL) 40 MG tablet Take 40 mg by mouth daily. 03/11/19   [provider]  predniSONE (DELTASONE) 50 MG tablet 1 tablet PO QD X5 days 07/16/19   Zadie Rhine, MD      Allergies    Patient has no known allergies.    Review of Systems   Review of Systems  Constitutional:  Negative for chills and fever.  Eyes:  Negative for photophobia and visual disturbance.  Respiratory:  Negative for cough and shortness of breath.   Cardiovascular:  Positive for syncope. Negative for chest pain and palpitations.  Gastrointestinal:  Negative for abdominal pain, constipation, diarrhea, nausea and vomiting.  Neurological:  Positive for syncope and headaches.    Physical Exam Updated Vital Signs BP (!) 144/75   Pulse (!) 56   Temp 97.6 F (36.4 C) (Oral)   Resp 18   Ht 5\' 11"  (1.803 m)   Wt 77.1 kg   SpO2 95%   BMI 23.71  kg/m  Physical Exam Vitals and nursing note reviewed.  Constitutional:      General: He is not in acute distress.    Appearance: He is not toxic-appearing.  HENT:     Head:     Comments: Large scalp laceration seen starting at the crown going over to the right parietal region.  Please see image.  I do not appreciate any step-offs or deformities.  No battle signs or raccoon eyes.    Right Ear: Tympanic membrane, ear canal and external ear normal.     Left Ear: Tympanic membrane, ear canal and external ear normal.     Mouth/Throat:     Mouth: Mucous membranes are moist.  Eyes:     General: No scleral icterus.     Extraocular Movements: Extraocular movements intact.     Pupils: Pupils are equal, round, and reactive to light.  Neck:     Comments: No step-offs or deformities.  No signs of trauma. Cardiovascular:     Rate and Rhythm: Normal rate.     Pulses: Normal pulses.  Pulmonary:     Effort: Pulmonary effort is normal. No respiratory distress.  Abdominal:     Palpations: Abdomen is soft.     Tenderness: There is no abdominal tenderness. There is no guarding or rebound.  Musculoskeletal:     Cervical back: Normal range of motion. No tenderness.  Skin:    General: Skin is warm and dry.  Neurological:     General: No focal deficit present.     Mental Status: He is alert. Mental status is at baseline.     Cranial Nerves: No cranial nerve deficit.     Motor: No weakness.     Comments: Patient is at baseline per wife.  Confused on the year and month however does know today is Sunday.  Wife reports this is typical for him.  He is moving all extremities.  Do not appreciate any focal deficit.  Cranial nerves II to XII intact.  Reportedly sensations intact throughout.  No pronator drift.       ED Results / Procedures / Treatments   Labs (all labs ordered are listed, but only abnormal results are displayed) Labs Reviewed  COMPREHENSIVE METABOLIC PANEL - Abnormal; Notable for the following components:      Result Value   Glucose, Bld 137 (*)    BUN 24 (*)    All other components within normal limits  CBC WITH DIFFERENTIAL/PLATELET - Abnormal; Notable for the following components:   Hemoglobin 11.9 (*)    HCT 36.9 (*)    RDW 15.9 (*)    All other components within normal limits  CBG MONITORING, ED - Abnormal; Notable for the following components:   Glucose-Capillary 108 (*)    All other components within normal limits  URINALYSIS, ROUTINE W REFLEX MICROSCOPIC  MAGNESIUM  CBC WITH DIFFERENTIAL/PLATELET  TROPONIN I (HIGH SENSITIVITY)  TROPONIN I (HIGH SENSITIVITY)    EKG EKG  Interpretation  Date/Time:  Sunday March 26 2023 11:02:27 EDT Ventricular Rate:  53 PR Interval:  185 QRS Duration: 113 QT Interval:  434 QTC Calculation: 408 R Axis:   54 Text Interpretation: Sinus rhythm Borderline intraventricular conduction delay Low voltage, precordial leads No significant change since last tracing Confirmed by Alvira Monday ([id number]) on 03/26/2023 12:19:51 PM  Radiology CT Head Wo Contrast  Result Date: 03/26/2023 CLINICAL DATA:  Trauma EXAM: CT HEAD WITHOUT CONTRAST CT CERVICAL SPINE WITHOUT CONTRAST TECHNIQUE: Multidetector CT imaging of the head  and cervical spine was performed following the standard protocol without intravenous contrast. Multiplanar CT image reconstructions of the cervical spine were also generated. RADIATION DOSE REDUCTION: This exam was performed according to the departmental dose-optimization program which includes automated exposure control, adjustment of the mA and/or kV according to patient size and/or use of iterative reconstruction technique. COMPARISON:  11/26/2022 FINDINGS: CT HEAD FINDINGS Brain: No evidence of acute infarction, hemorrhage, hydrocephalus, extra-axial collection or mass lesion/mass effect. Periventricular white matter hypodensity. Unchanged pituitary mass measuring 1.5 cm (series 5, image 29). Vascular: No hyperdense vessel or unexpected calcification. Skull: Normal. Negative for fracture or focal lesion. Sinuses/Orbits: No acute finding. Other: Soft tissue laceration of the right scalp vertex. CT CERVICAL SPINE FINDINGS Alignment: Straightening of the normal cervical lordosis. Skull base and vertebrae: No acute fracture. No primary bone lesion or focal pathologic process. Soft tissues and spinal canal: No prevertebral fluid or swelling. No visible canal hematoma. Disc levels: Mild-to-moderate multilevel disc space height loss and osteophytosis, worst at C6-C7 (series 4, image 43). Upper chest: Negative. Other: None. IMPRESSION: 1.  No acute intracranial pathology. Small-vessel white matter disease. 2. Soft tissue laceration of the right scalp vertex. 3. No fracture or static subluxation of the cervical spine. 4. Mild-to-moderate multilevel cervical disc degenerative disease. Electronically Signed   By: Jearld Lesch M.D.   On: 03/26/2023 11:55   CT Cervical Spine Wo Contrast  Result Date: 03/26/2023 CLINICAL DATA:  Trauma EXAM: CT HEAD WITHOUT CONTRAST CT CERVICAL SPINE WITHOUT CONTRAST TECHNIQUE: Multidetector CT imaging of the head and cervical spine was performed following the standard protocol without intravenous contrast. Multiplanar CT image reconstructions of the cervical spine were also generated. RADIATION DOSE REDUCTION: This exam was performed according to the departmental dose-optimization program which includes automated exposure control, adjustment of the mA and/or kV according to patient size and/or use of iterative reconstruction technique. COMPARISON:  11/26/2022 FINDINGS: CT HEAD FINDINGS Brain: No evidence of acute infarction, hemorrhage, hydrocephalus, extra-axial collection or mass lesion/mass effect. Periventricular white matter hypodensity. Unchanged pituitary mass measuring 1.5 cm (series 5, image 29). Vascular: No hyperdense vessel or unexpected calcification. Skull: Normal. Negative for fracture or focal lesion. Sinuses/Orbits: No acute finding. Other: Soft tissue laceration of the right scalp vertex. CT CERVICAL SPINE FINDINGS Alignment: Straightening of the normal cervical lordosis. Skull base and vertebrae: No acute fracture. No primary bone lesion or focal pathologic process. Soft tissues and spinal canal: No prevertebral fluid or swelling. No visible canal hematoma. Disc levels: Mild-to-moderate multilevel disc space height loss and osteophytosis, worst at C6-C7 (series 4, image 43). Upper chest: Negative. Other: None. IMPRESSION: 1. No acute intracranial pathology. Small-vessel white matter disease. 2. Soft  tissue laceration of the right scalp vertex. 3. No fracture or static subluxation of the cervical spine. 4. Mild-to-moderate multilevel cervical disc degenerative disease. Electronically Signed   By: Jearld Lesch M.D.   On: 03/26/2023 11:55    Procedures Procedures   Medications Ordered in ED Medications  lidocaine-EPINEPHrine-tetracaine (LET) topical gel (has no administration in time range)  lidocaine-EPINEPHrine (XYLOCAINE W/EPI) 2 %-1:200000 (PF) injection 10 mL (has no administration in time range)    ED Course/ Medical Decision Making/ A&P                           Medical Decision Making Amount and/or Complexity of Data Reviewed Labs: ordered. Radiology: ordered.  Risk Prescription drug management.   88 y.o. male presents to the ER for  evaluation of syncopal episode and scalp laceration. Differential diagnosis includes but is not limited to CVA, ACS, arrhythmia, vasovagal syncope, orthostatic hypotension, sepsis, hypoglycemia, electrolyte disturbance, respiratory failure, symptomatic anemia, dehydration, heat injury, polypharmacy, malignancy, anxiety/panic attack. Vital signs hypertension otherwise unremarkable. Physical exam as noted above.   I independently reviewed and interpreted the patient's labs.  CMP shows glucose of 137 BUN 24 otherwise no electrolyte or LFT abnormality.  Hemoglobin shows mild anemia at 11.9.  Wife reports that at his baseline actually improved from his last 1.  Troponin at 3.  Urinalysis is unremarkable.  1. No acute intracranial pathology. Small-vessel white matter disease. 2. Soft tissue laceration of the right scalp vertex. 3. No fracture or static subluxation of the cervical spine. 4. Mild-to-moderate multilevel cervical disc degenerative disease.   EKG reviewed and interpreted to my attending and read as Sinus rhythm Borderline intraventricular conduction delay Low voltage, precordial leads No significant change since last tracing.  Head  laceration was repaired by my colleague.  Please see their note for additional information on this.  From previous chart evaluation, seen and has been seen multiple times by internal medicine and cardiology for syncope with device checks as well.  Overall, EKG unchanged, CT without signs of intracranial trauma.  Patient asymptomatic now  Offered admission due to patient's age and syncopal episodes however wife reports this is typical for him and he is at his baseline and does not think that he needs admission.  She reports that they already have follow-up for syncopal episodes and this is not atypical for him.  She would like to take him home.  We discussed the results of the labs/imaging. The plan is return for staple removal, follow up with PCP. We discussed strict return precautions and red flag symptoms. The patient verbalized their understanding and agrees to the plan. The patient is stable and being discharged home in good condition.  I discussed this case with my attending physician who cosigned this note including patient's presenting symptoms, physical exam, and planned diagnostics and interventions. Attending physician stated agreement with plan or made changes to plan which were implemented.   Portions of this report may have been transcribed using voice recognition software. Every effort was made to ensure accuracy; however, inadvertent computerized transcription errors may be present.   Final Clinical Impression(s) / ED Diagnoses Final diagnoses:  Laceration of scalp without foreign body, initial encounter  Syncope and collapse    Rx / DC Orders ED Discharge Orders     None         Achille Rich, PA-C 03/29/23 1317    Alvira Monday, MD 04/01/23 [phone]

## 2023-03-26 NOTE — Discharge Instructions (Addendum)
You were seen in the ER today for evaluation after your syncopal episode. You have a large scalp laceration that was repaired today. You will need to go to the ER, urgent care, or your primary care doctor in 10 days for removal of the staples.  Please make sure you are cleaning the wound daily with Dial soap and water.  You can apply an antibiotic ointment or Vaseline to the area as well.  I would avoid submerging your head in the water such as lakes, rivers, oceans, etc.  It is okay to still shower with this. Please make sure you follow-up with his cardiologist and primary care doctor about his continued syncopal episodes.  If you have any concerns, new or worsening symptoms, please return to the nearest emergency department for evaluation.  Contact a doctor if: You have episodes of near fainting. Get help right away if: You pass out or faint. You hit your head or are injured after fainting. You have any of these symptoms: Fast or uneven heartbeats (palpitations). Pain in your chest, belly, or back. Shortness of breath. You have jerky movements that you cannot control (seizure). You have a very bad headache. You are confused. You have problems with how you see (vision). You are very weak. You have trouble walking. You are bleeding from your mouth or your butt (rectum). You have black or tarry poop (stool). These symptoms may be an emergency. Get help right away. Call your local emergency services (911 in the [place] Do not wait to see if the symptoms will go away. Do not drive yourself to the hospital.

## 2023-03-26 NOTE — ED Triage Notes (Addendum)
The patient was standing up and had a syncopal episode. He hit his head on the ground. He has a lac to the top of his head. He denies blood thinner use. Bleeding controlled at this time. He has no other complaints at this time. Hx of syncope. Patient is CAOX4 at this time.

## 2023-03-26 NOTE — ED Notes (Signed)
Pt aware of need for urine sample , unable to go at this time will try again later.

## 2023-03-26 NOTE — ED Notes (Signed)
Patient transported to CT 

## 2023-03-26 NOTE — ED Provider Notes (Signed)
..  Laceration Repair  Date/Time: 03/26/2023 4:01 PM  Performed by: Olene Floss, PA-C Authorized by: Olene Floss, PA-C   Consent:    Consent obtained:  Verbal   Consent given by:  Patient   Risks, benefits, and alternatives were discussed: yes     Risks discussed:  Infection, pain, need for additional repair, poor cosmetic result, nerve damage and poor wound healing   Alternatives discussed:  No treatment Universal protocol:    Procedure explained and questions answered to patient or proxy's satisfaction: yes     Patient identity confirmed:  Verbally with patient Anesthesia:    Anesthesia method:  Local infiltration   Local anesthetic:  Lidocaine 2% WITH epi Laceration details:    Location:  Scalp   Scalp location:  R parietal   Length (cm):  8.5   Depth (mm):  6 Treatment:    Area cleansed with:  Saline, Shur-Clens and povidone-iodine   Amount of cleaning:  Extensive   Layers/structures repaired:  Vernona Rieger:    Suture size:  5-0   Suture material:  Vicryl   Suture technique:  Simple interrupted   Number of sutures:  2 Skin repair:    Repair method:  Staples   Number of staples:  10 Approximation:    Approximation:  Close Repair type:    Repair type:  Intermediate Post-procedure details:    Dressing:  Open (no dressing)   Procedure completion:  Adelene Amas, PA-C 03/26/23 1634    Alvira Monday, MD 03/27/23 1050

## 2023-03-26 NOTE — ED Notes (Signed)
Per Pt. Wife the Pt. Had an episode of LOC in the bathroom this morning and fell to the bathroom floor hitting the back of his head causing a laceration approx. 3 inches long.  Pt. Has had multiple falls due to LOC and has had multiple work ups due to this.  Pt. Wife explains that the Pt. Has some minor things with rhythm nothing consistent.

## 2023-10-04 DEATH — deceased
